# Patient Record
Sex: Female | Born: 1940 | Race: Black or African American | Hispanic: No | Marital: Married | Smoking: Never smoker
Health system: Southern US, Community
[De-identification: ages and names within clinical notes are randomized; demographics above are authoritative.]

## PROBLEM LIST (undated history)

## (undated) DIAGNOSIS — I639 Cerebral infarction, unspecified: Secondary | ICD-10-CM

## (undated) DIAGNOSIS — R42 Dizziness and giddiness: Secondary | ICD-10-CM

## (undated) HISTORY — PX: TOTAL HIP ARTHROPLASTY: SHX124

---

## 2019-06-03 ENCOUNTER — Ambulatory Visit (INDEPENDENT_AMBULATORY_CARE_PROVIDER_SITE_OTHER): Payer: Medicare PPO | Admitting: Internal Medicine

## 2019-06-04 ENCOUNTER — Other Ambulatory Visit: Payer: Self-pay

## 2019-06-04 ENCOUNTER — Encounter (INDEPENDENT_AMBULATORY_CARE_PROVIDER_SITE_OTHER): Payer: Self-pay | Admitting: *Deleted

## 2019-06-04 ENCOUNTER — Ambulatory Visit (INDEPENDENT_AMBULATORY_CARE_PROVIDER_SITE_OTHER): Payer: Medicare PPO | Admitting: Internal Medicine

## 2019-06-04 ENCOUNTER — Encounter (INDEPENDENT_AMBULATORY_CARE_PROVIDER_SITE_OTHER): Payer: Self-pay | Admitting: Internal Medicine

## 2019-06-04 VITALS — BP 142/90 | HR 79 | Temp 97.6°F | Ht 63.0 in | Wt 159.2 lb

## 2019-06-04 DIAGNOSIS — R059 Cough, unspecified: Secondary | ICD-10-CM

## 2019-06-04 DIAGNOSIS — K219 Gastro-esophageal reflux disease without esophagitis: Secondary | ICD-10-CM | POA: Diagnosis not present

## 2019-06-04 DIAGNOSIS — R05 Cough: Secondary | ICD-10-CM

## 2019-06-04 NOTE — Patient Instructions (Signed)
DG Esophagram.  

## 2019-06-04 NOTE — Progress Notes (Signed)
   Subjective:    Patient ID: Dominique Jenkins, female    DOB: [birth year], 78 y.o.   MRN: [id number]  HPI Referred by Jolyn Nap for GERD. She has seen an ENT and was told she had GERD. Diagnosis of laryngopharyngeal reflux. She saw Dr. Brien Mates. He ordered a 24 esophageal PH test and Esophageal Manometry but family decided against it. Did not do a laryngoscopy  Daughter states her mother has a persistent cough. She coughs all day. The cough comes and goes. Cough since last year.  No dysphagia. Her appetite is good.She denies any GERD. BMs are normal.    Review of Systems History reviewed. No pertinent past medical history.  Past Surgical History:  Procedure Laterality Date  . TOTAL HIP ARTHROPLASTY     2008    No Known Allergies  Current Outpatient Medications on File Prior to Visit  Medication Sig Dispense Refill  . acetaminophen (TYLENOL) 325 MG tablet Take 650 mg by mouth every 6 (six) hours as needed.    Marland Kitchen aspirin 81 MG chewable tablet Chew by mouth daily.    . Cholecalciferol (VITAMIN D3) 10 MCG (400 UNIT) CAPS Take by mouth.    Marland Kitchen omeprazole (PRILOSEC) 20 MG capsule Take 20 mg by mouth 2 (two) times daily before a meal.     No current facility-administered medications on file prior to visit.         Objective:   Physical Exam Blood pressure (!) 142/90, pulse 79, temperature 97.6 F (36.4 C), height 5\' 3"  (1.6 m), weight 159 lb 3.2 oz (72.2 kg). Alert and oriented. Skin warm and dry. Oral mucosa is moist.   . Sclera anicteric, conjunctivae is pink. Thyroid not enlarged. No cervical lymphadenopathy. Lungs clear. Heart regular rate and rhythm.  Abdomen is soft. Bowel sounds are positive. No hepatomegaly. No abdominal masses felt. No tenderness.  No edema to lower extremities.        Assessment & Plan:  GERD. Chronic Cough. Will get a DG esophagram. ? Aspiration. Further recommendations to follow.

## 2019-06-10 ENCOUNTER — Ambulatory Visit (HOSPITAL_COMMUNITY)
Admission: RE | Admit: 2019-06-10 | Discharge: 2019-06-10 | Disposition: A | Discharge status: Home / Self Care | Payer: Medicare PPO | Source: Ambulatory Visit | Attending: Internal Medicine | Admitting: Internal Medicine

## 2019-06-10 ENCOUNTER — Other Ambulatory Visit (INDEPENDENT_AMBULATORY_CARE_PROVIDER_SITE_OTHER): Payer: Self-pay | Admitting: Internal Medicine

## 2019-06-10 ENCOUNTER — Other Ambulatory Visit: Payer: Self-pay

## 2019-06-10 DIAGNOSIS — R059 Cough, unspecified: Secondary | ICD-10-CM

## 2019-06-10 DIAGNOSIS — R05 Cough: Secondary | ICD-10-CM

## 2019-06-10 DIAGNOSIS — K219 Gastro-esophageal reflux disease without esophagitis: Secondary | ICD-10-CM | POA: Diagnosis present

## 2019-06-30 ENCOUNTER — Telehealth (INDEPENDENT_AMBULATORY_CARE_PROVIDER_SITE_OTHER): Payer: Self-pay | Admitting: Internal Medicine

## 2019-06-30 NOTE — Telephone Encounter (Signed)
Please call daughter Lawrence Santiago regarding test results - ph# [phone]

## 2019-06-30 NOTE — Telephone Encounter (Signed)
Message left on answering machine to return my call.  

## 2019-07-02 ENCOUNTER — Telehealth (INDEPENDENT_AMBULATORY_CARE_PROVIDER_SITE_OTHER): Payer: Self-pay | Admitting: Internal Medicine

## 2019-07-02 NOTE — Telephone Encounter (Signed)
Needs OV in 3-4 months with Dr. Laural Golden

## 2019-07-02 NOTE — Telephone Encounter (Signed)
Results given to daughter. I had been unable to reach her.  Will let Dr. Laural Golden re-evaluate in 3-4 months to see if she needs to be dilated. Right now, she is not having any problems swallowing. Does have a cough. No reflux seen on Esophagram.

## 2019-07-23 ENCOUNTER — Emergency Department (HOSPITAL_COMMUNITY): Payer: Medicare PPO

## 2019-07-23 ENCOUNTER — Emergency Department (HOSPITAL_COMMUNITY)
Admission: EM | Admit: 2019-07-23 | Discharge: 2019-07-23 | Disposition: A | Discharge status: Home / Self Care | Payer: Medicare PPO | Attending: Emergency Medicine | Admitting: Emergency Medicine

## 2019-07-23 ENCOUNTER — Encounter (HOSPITAL_COMMUNITY): Payer: Self-pay | Admitting: Emergency Medicine

## 2019-07-23 ENCOUNTER — Other Ambulatory Visit: Payer: Self-pay

## 2019-07-23 DIAGNOSIS — Z8673 Personal history of transient ischemic attack (TIA), and cerebral infarction without residual deficits: Secondary | ICD-10-CM | POA: Diagnosis not present

## 2019-07-23 DIAGNOSIS — Z79899 Other long term (current) drug therapy: Secondary | ICD-10-CM | POA: Insufficient documentation

## 2019-07-23 DIAGNOSIS — R112 Nausea with vomiting, unspecified: Secondary | ICD-10-CM | POA: Diagnosis not present

## 2019-07-23 DIAGNOSIS — Z7982 Long term (current) use of aspirin: Secondary | ICD-10-CM | POA: Diagnosis not present

## 2019-07-23 DIAGNOSIS — Z20828 Contact with and (suspected) exposure to other viral communicable diseases: Secondary | ICD-10-CM | POA: Diagnosis not present

## 2019-07-23 DIAGNOSIS — R1110 Vomiting, unspecified: Secondary | ICD-10-CM | POA: Diagnosis present

## 2019-07-23 HISTORY — DX: Dizziness and giddiness: R42

## 2019-07-23 HISTORY — DX: Cerebral infarction, unspecified: I63.9

## 2019-07-23 LAB — COMPREHENSIVE METABOLIC PANEL
ALT: 13 U/L (ref 0–44)
AST: 19 U/L (ref 15–41)
Albumin: 3.8 g/dL (ref 3.5–5.0)
Alkaline Phosphatase: 69 U/L (ref 38–126)
Anion gap: 10 (ref 5–15)
BUN: 12 mg/dL (ref 8–23)
CO2: 24 mmol/L (ref 22–32)
Calcium: 9.1 mg/dL (ref 8.9–10.3)
Chloride: 102 mmol/L (ref 98–111)
Creatinine, Ser: 0.77 mg/dL (ref 0.44–1.00)
GFR calc Af Amer: >60 mL/min (ref >60)
GFR calc non Af Amer: >60 mL/min (ref >60)
Glucose, Bld: 107 mg/dL — ABNORMAL HIGH (ref 70–99)
Potassium: 3.5 mmol/L (ref 3.5–5.1)
Sodium: 136 mmol/L (ref 135–145)
Total Bilirubin: 0.6 mg/dL (ref 0.3–1.2)
Total Protein: 7.7 g/dL (ref 6.5–8.1)

## 2019-07-23 LAB — URINALYSIS, ROUTINE W REFLEX MICROSCOPIC
Bacteria, UA: NONE SEEN
Bilirubin Urine: NEGATIVE
Glucose, UA: NEGATIVE mg/dL
Ketones, ur: NEGATIVE mg/dL
Leukocytes,Ua: NEGATIVE
Nitrite: NEGATIVE
Protein, ur: NEGATIVE mg/dL
Specific Gravity, Urine: 1.014 (ref 1.005–1.030)
pH: 7 (ref 5.0–8.0)

## 2019-07-23 LAB — CBC
HCT: 41 % (ref 36.0–46.0)
Hemoglobin: 12.5 g/dL (ref 12.0–15.0)
MCH: 28.3 pg (ref 26.0–34.0)
MCHC: 30.5 g/dL (ref 30.0–36.0)
MCV: 93 fL (ref 80.0–100.0)
Platelets: 256 10*3/uL (ref 150–400)
RBC: 4.41 MIL/uL (ref 3.87–5.11)
RDW: 13 % (ref 11.5–15.5)
WBC: 8 10*3/uL (ref 4.0–10.5)
nRBC: 0 % (ref 0.0–0.2)

## 2019-07-23 LAB — LIPASE, BLOOD: Lipase: 24 U/L (ref 11–51)

## 2019-07-23 MED ORDER — BENZONATATE 100 MG PO CAPS: 100.0000 mg | ORAL_CAPSULE | Freq: Three times a day (TID) | ORAL | 0 refills | Status: DC

## 2019-07-23 MED ORDER — IOHEXOL 300 MG/ML  SOLN
100.0000 mL | Freq: Once | INTRAMUSCULAR | Status: AC | PRN
  Administered 2019-07-23: 100 mL via INTRAVENOUS

## 2019-07-23 MED ORDER — SODIUM CHLORIDE 0.9 % IV BOLUS
1000.0000 mL | Freq: Once | INTRAVENOUS | Status: AC
  Administered 2019-07-23: 16:00:00 1000 mL via INTRAVENOUS

## 2019-07-23 MED ORDER — ALUMINUM-MAGNESIUM-SIMETHICONE 200-200-20 MG/5ML PO SUSP: 30.0000 mL | Freq: Three times a day (TID) | ORAL | 0 refills | Status: DC

## 2019-07-23 MED ORDER — SODIUM CHLORIDE 0.9% FLUSH: 3.0000 mL | Freq: Once | INTRAVENOUS | Status: DC

## 2019-07-23 NOTE — ED Triage Notes (Signed)
Patient started vomiting yesterday, was seen at another facility, given Zofran. Patient dizzy with mvmt, started vomiting on awakening this am.

## 2019-07-23 NOTE — ED Notes (Signed)
Patient had a depend on which was soiled with urine.

## 2019-07-23 NOTE — Discharge Instructions (Signed)
Please do not take the Zofran that was prescribed to you. Take the Maalox and the Tessalon Perles as needed for your symptoms. Return to the ED if you start to have worsening symptoms, increased abdominal pain or develop a fever, vomiting or coughing up blood, blood in your stool, lightheadedness or increased confusion.

## 2019-07-23 NOTE — ED Provider Notes (Signed)
[facility] EMERGENCY DEPARTMENT Provider Note   CSN: [id number] Arrival date & time: 07/23/19  1309    History   Chief Complaint Chief Complaint  Patient presents with   Emesis    HPI Dominique KatayamaDorothy Jenkins is a 78 y.o. 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  States that yesterday after dinner patient was at home with her husband when she began vomiting.  Daughter at bedside provides much of history.  She had a normal bowel movement yesterday.  Due to the intractable vomiting, patient [facility] with [facility].  She had lab work, chest and abdominal x-ray done which she believes were unremarkable.  She was given ODT Zofran and discharged home.  Tried to take ODT Zofran this morning but had persistent vomiting.  Now complaining of dizziness since waking up this morning feeling like the room is spinning around her worse with movement.  Denies any abdominal pain.  Daughter at bedside states that patient is at her mental baseline.  Daughter states that patient was not given IV fluids and urine was not checked yesterday which she is concerned about.  Denies any complaints of dysuria, fever, sick contacts with similar symptoms.     HPI  Past Medical History:  Diagnosis Date   Stroke (HCC)    2011, r sided weakness   Vertigo     Patient Active Problem List   Diagnosis Date Noted   GERD (gastroesophageal reflux disease) 06/04/2019    Past Surgical History:  Procedure Laterality Date   TOTAL HIP ARTHROPLASTY     2008     OB History    Gravida  3   Para  3   Term  3   Preterm      AB      Living        SAB      TAB      Ectopic      Multiple      Live Births               Home Medications    Prior to Admission medications   Medication Sig Start Date End Date Taking? Authorizing Provider  acetaminophen (TYLENOL) 325 MG tablet  Take 650 mg by mouth every 6 (six) hours as needed.   Yes [provider]  aspirin 81 MG chewable tablet Chew by mouth daily.   Yes [provider]  Cholecalciferol (VITAMIN D3) 10 MCG (400 UNIT) CAPS Take 400 Units by mouth daily.    Yes [provider]  omeprazole (PRILOSEC) 20 MG capsule Take 20 mg by mouth 2 (two) times daily before a meal.   Yes [provider]  ondansetron (ZOFRAN-ODT) 4 MG disintegrating tablet Take 1 tablet by mouth every 6 (six) hours as needed. 07/23/19  Yes [provider]  aluminum-magnesium hydroxide-simethicone (MAALOX) 200-200-20 MG/5ML SUSP Take 30 mLs by mouth 4 (four) times daily -  before meals and at bedtime. 07/23/19   Dominique Gandolfi, PA-C  benzonatate (TESSALON) 100 MG capsule Take 1 capsule (100 mg total) by mouth every 8 (eight) hours. 07/23/19   Dietrich PatesKhatri, Dominique Kruser, PA-C    Family History Family History  Family history unknown: Yes    Social History Social History   Tobacco Use   Smoking status: Never Smoker   Smokeless tobacco: Never Used  Substance Use Topics   Alcohol use: Never  Frequency: Never   Drug use: Never     Allergies   Patient has no known allergies.   Review of Systems Review of Systems  Constitutional: Negative for appetite change, chills and fever.  HENT: Negative for ear pain, rhinorrhea, sneezing and sore throat.   Eyes: Negative for photophobia and visual disturbance.  Respiratory: Negative for cough, chest tightness, shortness of breath and wheezing.   Cardiovascular: Negative for chest pain and palpitations.  Gastrointestinal: Positive for vomiting. Negative for abdominal pain, blood in stool, constipation, diarrhea and nausea.  Genitourinary: Negative for dysuria, hematuria and urgency.  Musculoskeletal: Negative for myalgias.  Skin: Negative for rash.  Neurological: Positive for dizziness. Negative for weakness and light-headedness.     Physical Exam Updated Vital  Signs BP 138/71 (BP Location: Left Arm)    Pulse 92    Temp 98.4 F (36.9 C) (Oral)    Resp 18    SpO2 93%   Physical Exam Vitals signs and nursing note reviewed.  Constitutional:      General: She is not in acute distress.    Appearance: She is well-developed.  HENT:     Head: Normocephalic and atraumatic.     Nose: Nose normal.  Eyes:     General: No scleral icterus.       Right eye: No discharge.        Left eye: No discharge.     Conjunctiva/sclera: Conjunctivae normal.     Pupils: Pupils are equal, round, and reactive to light.     Comments: Horizontal nystagmus noted bilaterally.  Neck:     Musculoskeletal: Normal range of motion and neck supple.  Cardiovascular:     Rate and Rhythm: Normal rate and regular rhythm.     Heart sounds: Normal heart sounds. No murmur. No friction rub. No gallop.   Pulmonary:     Effort: Pulmonary effort is normal. No respiratory distress.     Breath sounds: Normal breath sounds.  Abdominal:     General: Bowel sounds are normal. There is no distension.     Palpations: Abdomen is soft.     Tenderness: There is no abdominal tenderness. There is no guarding.  Musculoskeletal: Normal range of motion.  Skin:    General: Skin is warm and dry.     Findings: No rash.  Neurological:     Mental Status: She is alert.     Motor: No abnormal muscle tone.     Coordination: Coordination normal.     Comments: Oriented to self, family member at bedside.  [place] but does not know the city.  Does not know the current year or day of the week or month. Pupils reactive. No facial asymmetry noted. Cranial nerves appear grossly intact. Sensation intact to light touch on face, BUE and BLE. Strength 5/5 in BUE and BLE.       ED Treatments / Results  Labs (all labs ordered are listed, but only abnormal results are displayed) Labs Reviewed  COMPREHENSIVE METABOLIC PANEL - Abnormal; Notable for the following components:      Result  Value   Glucose, Bld 107 (*)    All other components within normal limits  URINALYSIS, ROUTINE W REFLEX MICROSCOPIC - Abnormal; Notable for the following components:   Color, Urine COLORLESS (*)    Hgb urine dipstick SMALL (*)    All other components within normal limits  URINE CULTURE  LIPASE, BLOOD  CBC    EKG None  Radiology Ct Chest W Contrast  Result Date: 07/23/2019 CLINICAL DATA:  Vomiting. EXAM: CT CHEST, ABDOMEN, AND PELVIS WITH CONTRAST TECHNIQUE: Multidetector CT imaging of the chest, abdomen and pelvis was performed following the standard protocol during bolus administration of intravenous contrast. CONTRAST:  100mL OMNIPAQUE IOHEXOL 300 MG/ML  SOLN COMPARISON:  None. FINDINGS: CT CHEST FINDINGS Cardiovascular: Heart size upper normal. No substantial pericardial effusion. Coronary artery calcification is evident. Atherosclerotic calcification is noted in the wall of the thoracic aorta. Mediastinum/Nodes: 11 mm short axis low right paratracheal node is mildly enlarged. The 8 mm short axis AP window lymph node is upper normal. There is no hilar lymphadenopathy. The esophagus has normal imaging features. There is no axillary lymphadenopathy. Lungs/Pleura: Diffuse interstitial opacity is associated with bronchiectasis in all lobes of both lungs and subpleural reticulation with a basilar predominance. Scarring noted along the posterior right major fissure. No pleural effusion. Musculoskeletal: No worrisome lytic or sclerotic osseous abnormality. CT ABDOMEN PELVIS FINDINGS Hepatobiliary: Scattered small hypoattenuating lesions in the liver measure up to about 7 mm and are too small to characterize. Tiny layering gallstones noted. Common bile duct measures 8 mm in diameter, upper normal to minimally dilated for patient age. Pancreas: No focal mass lesion. No dilatation of the main duct. No intraparenchymal cyst. No peripancreatic edema. Spleen: No splenomegaly. No focal mass lesion.  Adrenals/Urinary Tract: No adrenal nodule or mass. Tiny bilateral hypoattenuating lesions are identified in both kidneys, too small to characterize. 8 mm exophytic lesion posterior lower left kidney (54/2) has attenuation higher than would be expected for a simple cyst. Similar 10 mm lesion noted posterior interpolar left kidney on 52/2. No evidence for hydroureter. Portions of the bladder are obscured by beam hardening artifact from left hip replacement. Stomach/Bowel: Stomach is unremarkable. No gastric wall thickening. No evidence of outlet obstruction. Duodenum is normally positioned as is the ligament of Treitz. No small bowel wall thickening. No small bowel dilatation. The terminal ileum is normal. The appendix is not visualized, but there is no edema or inflammation in the region of the cecum. No gross colonic mass. No colonic wall thickening. Diverticular changes are noted in the left colon without evidence of diverticulitis. Vascular/Lymphatic: There is abdominal aortic atherosclerosis without aneurysm. There is no gastrohepatic or hepatoduodenal ligament lymphadenopathy. No intraperitoneal or retroperitoneal lymphadenopathy. No pelvic sidewall lymphadenopathy. Reproductive: The uterus is unremarkable.  There is no adnexal mass. Other: No intraperitoneal free fluid. Musculoskeletal: Status post left hip replacement. No worrisome lytic or sclerotic osseous abnormality. IMPRESSION: 1. No acute findings in the chest, abdomen, or pelvis. 2. 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 3. Borderline mediastinal lymphadenopathy, presumably reactive. 4. Hepatic and renal cysts. Some lesions in the kidneys cannot be definitively characterized given their tiny size. 2 hyperattenuating lesions in the right kidney may be cysts complicated by proteinaceous debris or hemorrhage. Follow-up CT in 6 months could be used to ensure stability. 5.  Aortic Atherosclerois (ICD10-170.0)  Electronically Signed   By: Kennith CenterEric  Mansell M.D.   On: 07/23/2019 17:32   Mr Brain Wo Contrast  Result Date: 07/23/2019 CLINICAL DATA:  Peripheral vertigo. EXAM: MRI HEAD WITHOUT CONTRAST TECHNIQUE: Multiplanar, multiecho pulse sequences of the brain and surrounding structures were obtained without intravenous contrast. COMPARISON:  MRI head 03/24/2018 FINDINGS: Brain: Negative for acute infarct. Chronic hemorrhagic infarct left parietal lobe is unchanged. Smaller chronic infarct left frontal lobe also unchanged. In addition, there is extensive chronic microvascular ischemic change throughout the white matter.  Small chronic infarcts in the cerebellum bilaterally. Generalized atrophy without hydrocephalus. Negative for mass lesion. Vascular: Normal arterial flow voids Skull and upper cervical spine: Negative Sinuses/Orbits: Mild mucosal edema paranasal sinuses. Negative orbit Other: None IMPRESSION: No acute abnormality no change from the prior study Atrophy with extensive chronic ischemic change in the white matter as well as a chronic hemorrhagic infarct in the left parietal lobe. Chronic infarct in the left frontal lobe. Electronically Signed   By: Marlan Palau M.D.   On: 07/23/2019 14:45   Ct Abdomen Pelvis W Contrast  Result Date: 07/23/2019 CLINICAL DATA:  Vomiting. EXAM: CT CHEST, ABDOMEN, AND PELVIS WITH CONTRAST TECHNIQUE: Multidetector CT imaging of the chest, abdomen and pelvis was performed following the standard protocol during bolus administration of intravenous contrast. CONTRAST:  OMNIPAQUE IOHEXOL 300 MG/ML  SOLN COMPARISON:  None. FINDINGS: CT CHEST FINDINGS Cardiovascular: Heart size upper normal. No substantial pericardial effusion. Coronary artery calcification is evident. Atherosclerotic calcification is noted in the wall of the thoracic aorta. Mediastinum/Nodes: 11 mm short axis low right paratracheal node is mildly enlarged. The 8 mm short axis AP window lymph node is upper  normal. There is no hilar lymphadenopathy. The esophagus has normal imaging features. There is no axillary lymphadenopathy. Lungs/Pleura: Diffuse interstitial opacity is associated with bronchiectasis in all lobes of both lungs and subpleural reticulation with a basilar predominance. Scarring noted along the posterior right major fissure. No pleural effusion. Musculoskeletal: No worrisome lytic or sclerotic osseous abnormality. CT ABDOMEN PELVIS FINDINGS Hepatobiliary: Scattered small hypoattenuating lesions in the liver measure up to about 7 mm and are too small to characterize. Tiny layering gallstones noted. Common bile duct measures 8 mm in diameter, upper normal to minimally dilated for patient age. Pancreas: No focal mass lesion. No dilatation of the main duct. No intraparenchymal cyst. No peripancreatic edema. Spleen: No splenomegaly. No focal mass lesion. Adrenals/Urinary Tract: No adrenal nodule or mass. Tiny bilateral hypoattenuating lesions are identified in both kidneys, too small to characterize. 8 mm exophytic lesion posterior lower left kidney (54/2) has attenuation higher than would be expected for a simple cyst. Similar 10 mm lesion noted posterior interpolar left kidney on 52/2. No evidence for hydroureter. Portions of the bladder are obscured by beam hardening artifact from left hip replacement. Stomach/Bowel: Stomach is unremarkable. No gastric wall thickening. No evidence of outlet obstruction. Duodenum is normally positioned as is the ligament of Treitz. No small bowel wall thickening. No small bowel dilatation. The terminal ileum is normal. The appendix is not visualized, but there is no edema or inflammation in the region of the cecum. No gross colonic mass. No colonic wall thickening. Diverticular changes are noted in the left colon without evidence of diverticulitis. Vascular/Lymphatic: There is abdominal aortic atherosclerosis without aneurysm. There is no gastrohepatic or hepatoduodenal  ligament lymphadenopathy. No intraperitoneal or retroperitoneal lymphadenopathy. No pelvic sidewall lymphadenopathy. Reproductive: The uterus is unremarkable.  There is no adnexal mass. Other: No intraperitoneal free fluid. Musculoskeletal: Status post left hip replacement. No worrisome lytic or sclerotic osseous abnormality. IMPRESSION: 1. No acute findings in the chest, abdomen, or pelvis. 2. 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 3. Borderline mediastinal lymphadenopathy, presumably reactive. 4. Hepatic and renal cysts. Some lesions in the kidneys cannot be definitively characterized given their tiny size. 2 hyperattenuating lesions in the right kidney may be cysts complicated by proteinaceous debris or hemorrhage. Follow-up CT in 6 months could be used to ensure stability. 5.  Aortic Atherosclerois (  ICD10-170.0) Electronically Signed   By: Misty Stanley M.D.   On: 07/23/2019 17:32    Procedures Procedures (including critical care time)  Medications Ordered in ED Medications  sodium chloride flush (NS) 0.9 % injection 3 mL (has no administration in time range)  sodium chloride 0.9 % bolus 1,000 mL (0 mLs Intravenous Stopped 07/23/19 1719)  iohexol (OMNIPAQUE) 300 MG/ML solution 100 mL (100 mLs Intravenous Contrast Given 07/23/19 1643)     Initial Impression / Assessment and Plan / ED Course  I have reviewed the triage vital signs and the nursing notes.  Pertinent labs & imaging results that were available during my care of the patient were reviewed by me and considered in my medical decision making (see chart for details).        78 year old female with a past medical history of prior CVA presents to ED for dizziness and emesis.  Symptoms began yesterday and worsened today.  She was seen in outside ED and prescribed Zofran after negative work-up.  Reports vertiginous symptoms.  Abdomen is soft, nontender nondistended.  There was some concern for abnormal  chest x-ray yesterday.  No deficits neurological exam noted.  Daughter at bedside states that she is at her mental baseline as far as orientation.  MRI of the brain is negative for acute abnormality.  CT of the abdomen and pelvis, chest with no acute findings.  There is some underlying fibrotic lung disease concern, hepatic and renal cysts.  Lab work here including CMP, CBC, lipase and urinalysis unremarkable.  Patient symptom control in the ED.  EKG shows borderline prolonged QT so we will discontinue use of Zofran.  We will have her follow-up with PCP and return for worsening symptoms.  Patient is hemodynamically stable, in NAD, and able to ambulate in the ED. Evaluation does not show pathology that would require ongoing emergent intervention or inpatient treatment. I explained the diagnosis to the patient. Pain has been managed and has no complaints prior to discharge. Patient is comfortable with above plan and is stable for discharge at this time. All questions were answered prior to disposition. Strict return precautions for returning to the ED were discussed. Encouraged follow up with PCP.   An After Visit Summary was printed and given to the patient.   Portions of this note were generated with [employer]. Dictation errors may occur despite best attempts at proofreading.   Final Clinical Impressions(s) / ED Diagnoses   Final diagnoses:  Non-intractable vomiting with nausea, unspecified vomiting type    ED Discharge Orders         Ordered    benzonatate (TESSALON) 100 MG capsule  Every 8 hours     07/23/19 1847    aluminum-magnesium hydroxide-simethicone (MAALOX) 200-200-20 MG/5ML SUSP  3 times daily before meals & bedtime     07/23/19 1847           Delia Heady, PA-C 07/23/19 1852    Lucrezia Starch, MD 07/24/19 [phone]

## 2019-07-24 LAB — SARS CORONAVIRUS 2 (TAT 6-24 HRS): SARS Coronavirus 2: NEGATIVE

## 2019-07-25 LAB — URINE CULTURE: Culture: NO GROWTH

## 2019-09-23 IMAGING — MR MRI HEAD WITHOUT CONTRAST
8 of 10 series · 35 of 48 positions shown · non-contrast
Comparison: MRI head 03/24/2018

CLINICAL DATA: Peripheral vertigo.

EXAM:
MRI HEAD WITHOUT CONTRAST
TECHNIQUE: Multiplanar, multiecho pulse sequences of the brain and surrounding
structures were obtained without intravenous contrast.

[Series 2: T1 · sagittal · 5.0mm · 0.40mm/px · 2 of 20 slices shown (1 of 2)]
[im 1/20]
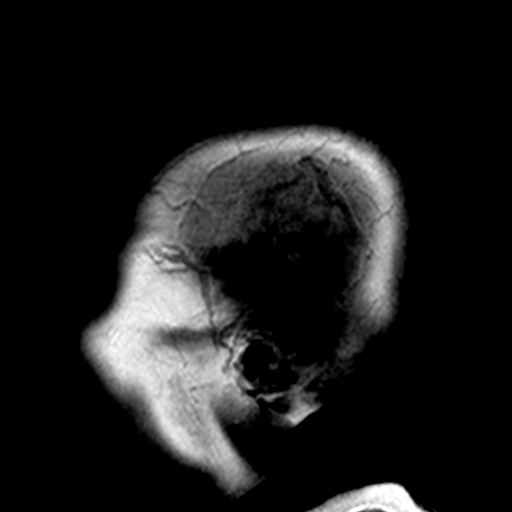
[im 20/20]
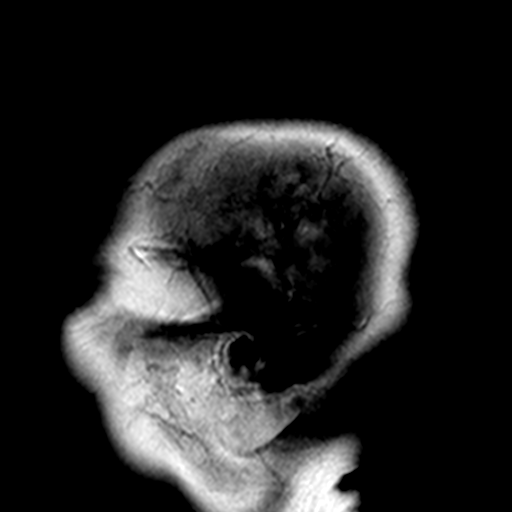

[Series 3: DWI · axial · 3.0mm · 0.74mm/px · z∈[-74,+84]mm · 7 of 54 slices shown (1 of 2)]
[im 1/54]
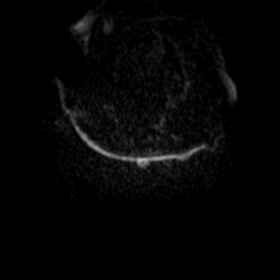
[im 9/54]
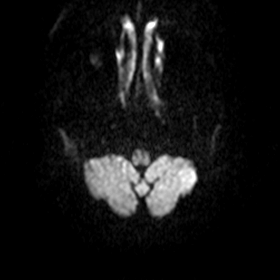
[im 18/54]
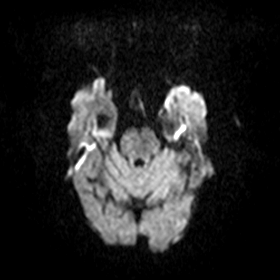
[im 27/54]
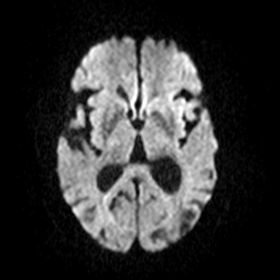
[im 36/54]
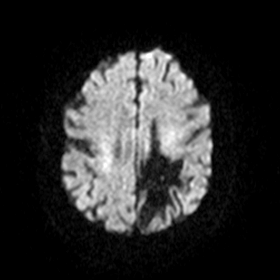
[im 45/54]
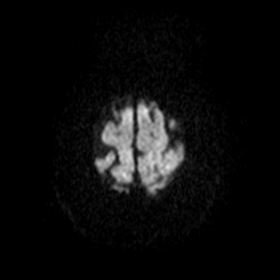
[im 54/54]
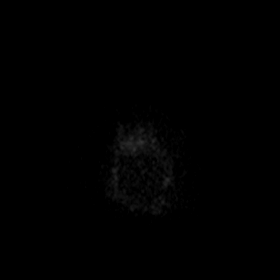

[Series 5: DWI · coronal · 5.0mm · 0.45mm/px · 4 of 34 slices shown (2 of 2)]
[im 1/34]
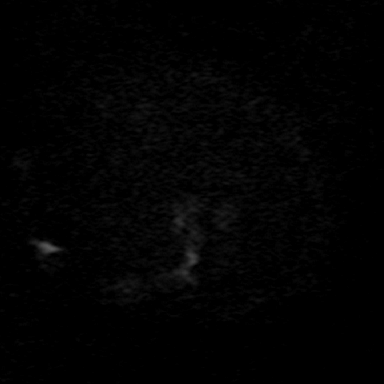
[im 12/34]
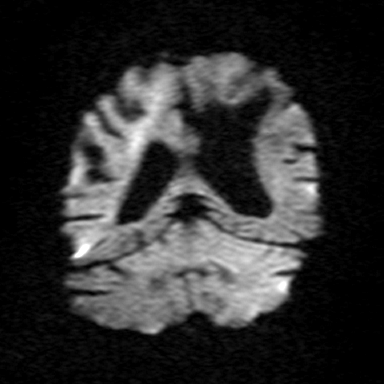
[im 23/34]
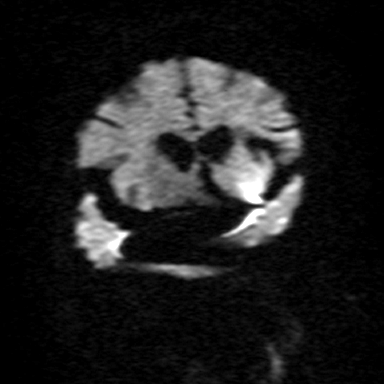
[im 34/34]
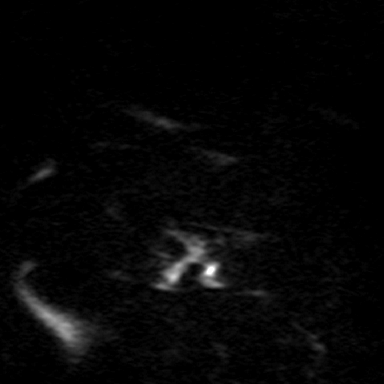

[Series 7: T2 · axial · 5.0mm · 0.66mm/px · z∈[-65,+75]mm · 3 of 23 slices shown (1 of 3)]
[im 1/23]
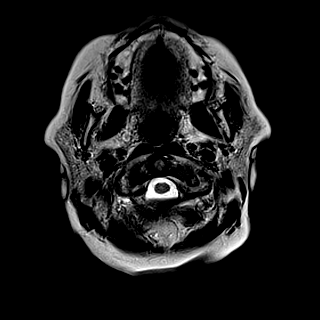
[im 12/23]
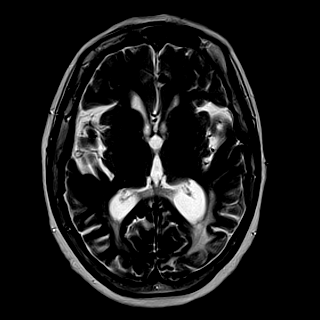
[im 23/23]
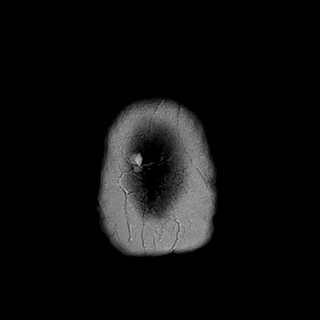

[Series 8: T2 · axial · 5.0mm · 0.41mm/px · z∈[-58,+69]mm · 3 of 21 slices shown (2 of 3)]
[im 1/21]
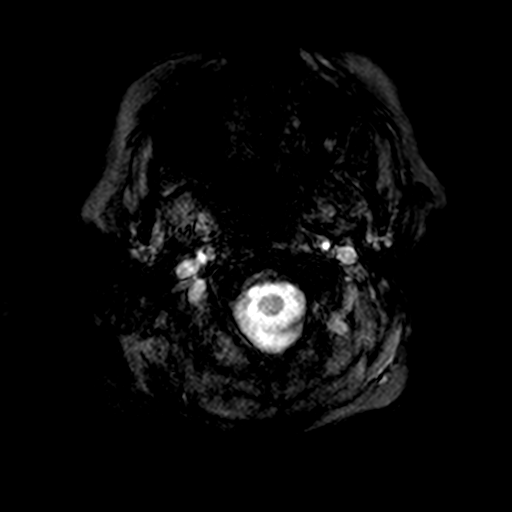
[im 11/21]
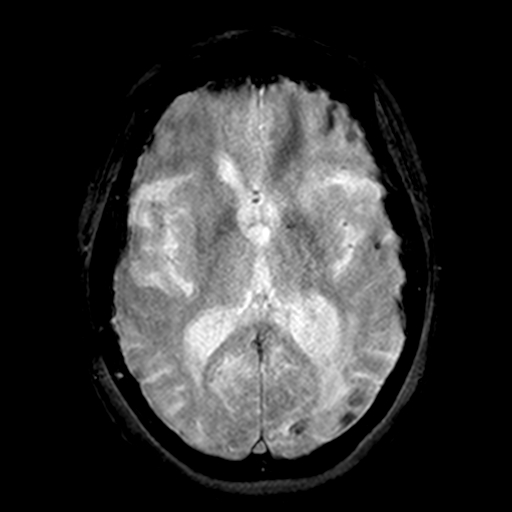
[im 21/21]
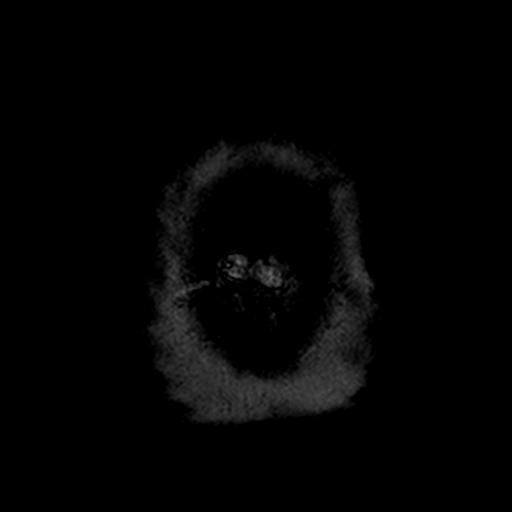

[Series 9: FLAIR · axial · 3.0mm · 0.79mm/px · z∈[-63,+72]mm · 6 of 47 slices shown]
[im 1/47]
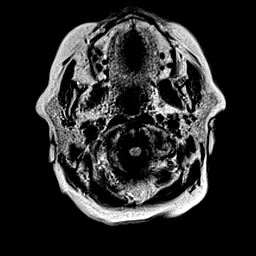
[im 10/47]
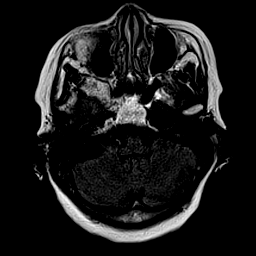
[im 19/47]
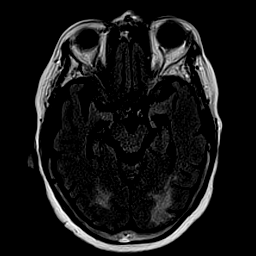
[im 28/47]
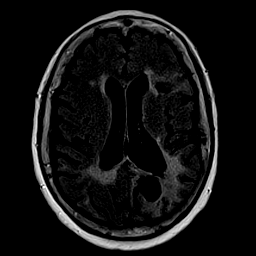
[im 37/47]
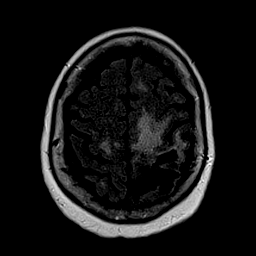
[im 47/47]
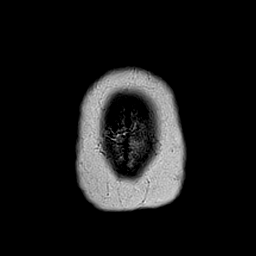

[Series 10: T1 · axial · 2.0mm · 0.40mm/px · z∈[-63,+56]mm · 7 of 71 slices shown (2 of 2)]
[im 1/71]
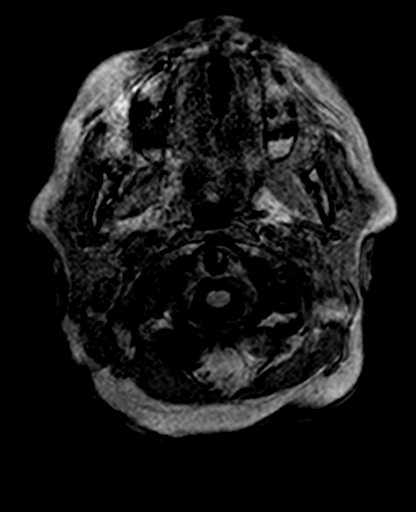
[im 9/71]
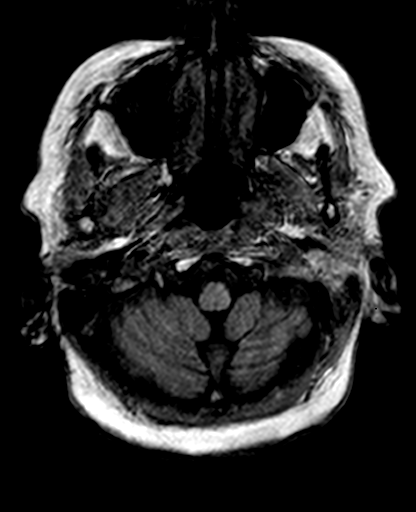
[im 18/71]
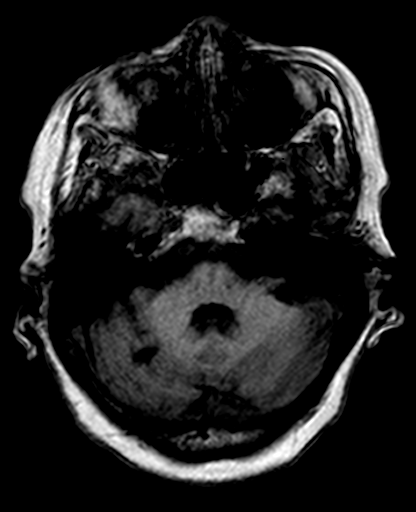
[im 27/71]
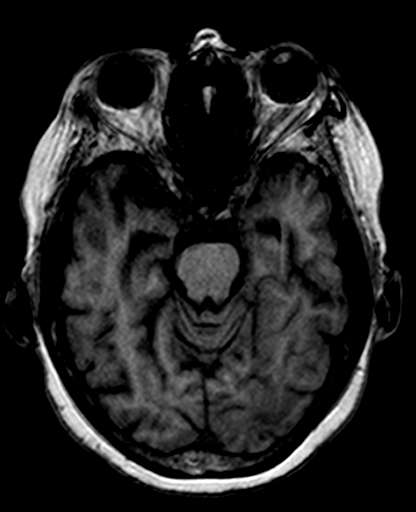
[im 44/71]
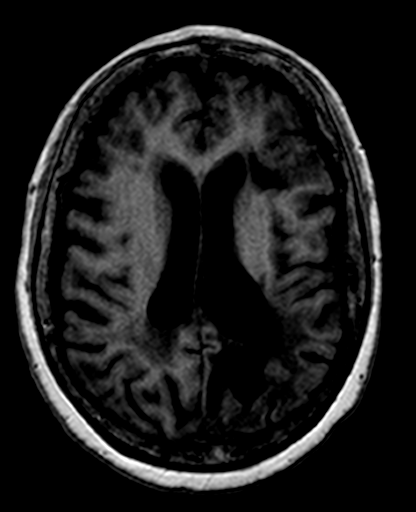
[im 53/71]
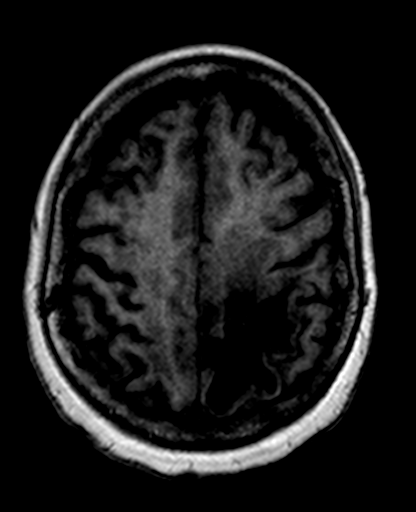
[im 62/71]
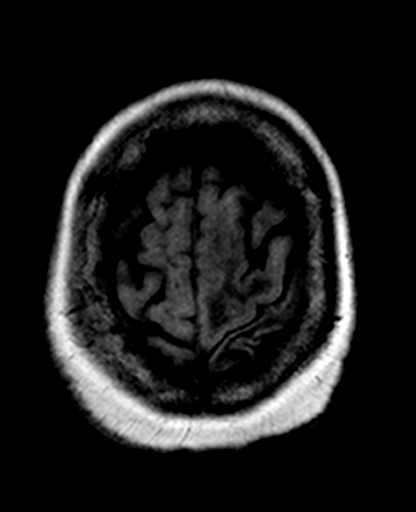

[Series 11: T2 · coronal · 5.0mm · 0.57mm/px · 3 of 28 slices shown (3 of 3)]
[im 1/28]
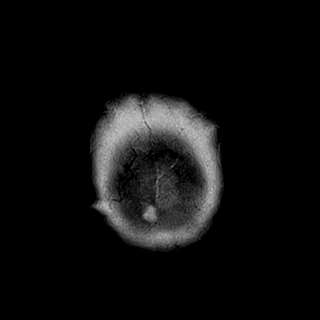
[im 14/28]
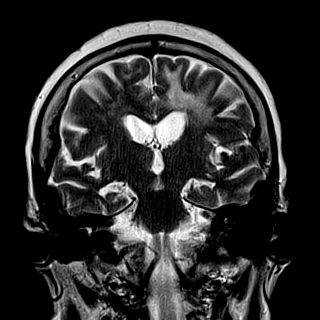
[im 28/28]
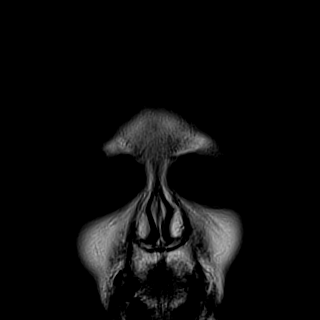

[35 of 48 positions shown; findings below may reference images not displayed]

FINDINGS: Brain: Negative for acute infarct.

Chronic hemorrhagic infarct left parietal lobe is unchanged. Smaller
chronic infarct left frontal lobe also unchanged. In addition, there
is extensive chronic microvascular ischemic change throughout the
white matter. Small chronic infarcts in the cerebellum bilaterally.
Generalized atrophy without hydrocephalus. Negative for mass lesion.

Vascular: Normal arterial flow voids

Skull and upper cervical spine: Negative

Sinuses/Orbits: Mild mucosal edema paranasal sinuses. Negative orbit

Other: None
IMPRESSION: No acute abnormality no change from the prior study

Atrophy with extensive chronic ischemic change in the white matter
as well as a chronic hemorrhagic infarct in the left parietal lobe.
Chronic infarct in the left frontal lobe.

## 2019-09-24 ENCOUNTER — Other Ambulatory Visit: Payer: Self-pay

## 2019-09-24 ENCOUNTER — Encounter (INDEPENDENT_AMBULATORY_CARE_PROVIDER_SITE_OTHER): Payer: Medicare PPO | Admitting: Ophthalmology

## 2019-09-24 DIAGNOSIS — H35342 Macular cyst, hole, or pseudohole, left eye: Secondary | ICD-10-CM | POA: Diagnosis not present

## 2019-09-24 DIAGNOSIS — I10 Essential (primary) hypertension: Secondary | ICD-10-CM

## 2019-09-24 DIAGNOSIS — H35033 Hypertensive retinopathy, bilateral: Secondary | ICD-10-CM | POA: Diagnosis not present

## 2019-09-24 DIAGNOSIS — H43813 Vitreous degeneration, bilateral: Secondary | ICD-10-CM

## 2019-09-24 DIAGNOSIS — H2513 Age-related nuclear cataract, bilateral: Secondary | ICD-10-CM

## 2019-10-26 ENCOUNTER — Encounter (INDEPENDENT_AMBULATORY_CARE_PROVIDER_SITE_OTHER): Payer: Self-pay | Admitting: Internal Medicine

## 2019-10-26 ENCOUNTER — Ambulatory Visit (INDEPENDENT_AMBULATORY_CARE_PROVIDER_SITE_OTHER): Payer: Medicare PPO | Admitting: Internal Medicine

## 2019-10-26 ENCOUNTER — Other Ambulatory Visit: Payer: Self-pay

## 2019-10-26 DIAGNOSIS — R05 Cough: Secondary | ICD-10-CM

## 2019-10-26 DIAGNOSIS — J849 Interstitial pulmonary disease, unspecified: Secondary | ICD-10-CM | POA: Insufficient documentation

## 2019-10-26 DIAGNOSIS — J8410 Pulmonary fibrosis, unspecified: Secondary | ICD-10-CM | POA: Diagnosis not present

## 2019-10-26 DIAGNOSIS — R059 Cough, unspecified: Secondary | ICD-10-CM | POA: Insufficient documentation

## 2019-10-26 MED ORDER — PANTOPRAZOLE SODIUM 40 MG PO TBEC: 40.0000 mg | DELAYED_RELEASE_TABLET | Freq: Every day | ORAL | 5 refills | Status: DC

## 2019-10-26 NOTE — Patient Instructions (Signed)
Pulmonary consultation to be arranged.

## 2019-10-26 NOTE — Progress Notes (Signed)
Presenting complaint;  Follow for chronic cough  Database and subjective:  Patient is 78 year old Afro-American female who is here for scheduled visit accompanied by his daughter Maren Beach for chronic cough over a year ago.  She was last seen on 06/04/2019.  She previously was evaluated by ENT specialist and no pharyngeal or laryngeal abnormality noted.  Patient has been on omeprazole but daughter believes it did not help with her cough.  Following her visit to our office she underwent esophagogram which revealed some residual contrast in the valleculae but there was no laryngeal aspiration.  She was felt to have marked diffuse esophageal dysmotility.  Barium pill got held up at distal esophagus just proximal to GE junction.  Patient was seen in emergency room on 07/23/2019 for vomiting and dizziness.  She had chest and abdominal pelvic CT.  Chest CT revealed parenchymal changes consistent with pulmonary fibrosis.  Patient was given albuterol inhaler by her primary care physician which she is using on as-needed basis and feels it may have helped decrease cough. Coughing occurs at different times.  She does bring out small amount of clear sputum. Patient denies heartburn regurgitation or dysphagia.  No history of peptic ulcer disease.  Her appetite is good.  She has lost 5 pounds since her last visit and her daughter states she is eating less sweets. 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 She denies abdominal pain melena or rectal bleeding.  Last colonoscopy was about 7 years ago and was normal. Patient is using walker to ambulate.   Current Medications: Outpatient Encounter Medications as of 10/26/2019  Medication Sig  . acetaminophen (TYLENOL) 325 MG tablet Take 650 mg by mouth every 6 (six) hours as needed.  Marland Kitchen aspirin 81 MG chewable tablet Chew by mouth daily.  . Cholecalciferol (VITAMIN D3) 10 MCG  (400 UNIT) CAPS Take 400 Units by mouth daily.   Marland Kitchen aluminum-magnesium hydroxide-simethicone (MAALOX) 200-200-20 MG/5ML SUSP Take 30 mLs by mouth 4 (four) times daily -  before meals and at bedtime. (Patient not taking: Reported on 10/26/2019)  . omeprazole (PRILOSEC) 20 MG capsule Take 20 mg by mouth 2 (two) times daily before a meal.  . ondansetron (ZOFRAN-ODT) 4 MG disintegrating tablet Take 1 tablet by mouth every 6 (six) hours as needed.  . [DISCONTINUED] benzonatate (TESSALON) 100 MG capsule Take 1 capsule (100 mg total) by mouth every 8 (eight) hours. (Patient not taking: Reported on 10/26/2019)   No facility-administered encounter medications on file as of 10/26/2019.     Objective: Blood pressure 119/75, pulse 80, temperature (!) 97.5 F (36.4 C), temperature source Oral, height 5\' 3"  (1.6 m), weight 154 lb 11.2 oz (70.2 kg). Patient is alert and in no acute distress. Her speech is normal. Conjunctiva is pink. Sclera is nonicteric Oropharyngeal mucosa is normal. No neck masses or thyromegaly noted. Cardiac exam with regular rhythm normal S1 and S2. No murmur or gallop noted. Lungs are clear to auscultation. Abdomen is soft and nontender with organomegaly or masses   Labs/studies Results: Barium study as well as chest and abdominal pelvic CT images reviewed with patient and her daughter.  Assessment:  #1.  Chronic cough.  She has been evaluated by ENT specialist in the past and she did not respond to omeprazole twice daily scheduled.  She does not have other symptoms that would go along with GERD.  Given CT pulmonary findings it is possible  her cough is due to lung disease.  Until pulmonary evaluation completed we will leave her on PPI but at a low dose.  #2.  Esophageal dysmotility.  Barium pill did not readily passed into the stomach.  However she does not have dysphagia with solids or pills.  Review of barium study does not reveal narrowing or stricture distally.  Therefore  would not recommend esophagogastroduodenoscopy unless she develops dysphagia.  #2.  Pulmonary fibrosis noted on CT that she had on 07/23/2019 during ER visit.  No exposure to asbestos.  No issues with allergies.  Patient is using albuterol inhaler on as-needed basis.  If it is helping with her cough she can use it on schedule may be three times a day.  Plan:  Will arrange for consultation with [facility] as Dr. Luan Pulling is not seeing new patients. Discontinue omeprazole. Begin pantoprazole 40 mg p.o. every morning.  She will take it 30 minutes before breakfast.  Will plan to drop the dose to every other day and thereafter on as-needed basis when pulmonary evaluation completed. Office visit on as-needed basis.

## 2019-11-02 ENCOUNTER — Ambulatory Visit (INDEPENDENT_AMBULATORY_CARE_PROVIDER_SITE_OTHER): Payer: Medicare PPO | Admitting: Internal Medicine

## 2019-12-30 ENCOUNTER — Institutional Professional Consult (permissible substitution): Payer: Medicare PPO | Admitting: Emergency Medicine

## 2019-12-31 ENCOUNTER — Telehealth: Payer: Self-pay | Admitting: Emergency Medicine

## 2019-12-31 ENCOUNTER — Ambulatory Visit: Payer: Medicare PPO | Admitting: Emergency Medicine

## 2019-12-31 ENCOUNTER — Other Ambulatory Visit: Payer: Self-pay

## 2019-12-31 ENCOUNTER — Encounter: Payer: Self-pay | Admitting: Emergency Medicine

## 2019-12-31 DIAGNOSIS — J849 Interstitial pulmonary disease, unspecified: Secondary | ICD-10-CM

## 2019-12-31 DIAGNOSIS — R05 Cough: Secondary | ICD-10-CM

## 2019-12-31 DIAGNOSIS — R059 Cough, unspecified: Secondary | ICD-10-CM

## 2019-12-31 NOTE — Telephone Encounter (Signed)
Patient was seen in clinic today by Dr. Delton Coombes. Only had labs done. PFT was ordered. No notation regarding a call made to the patient daughter.

## 2019-12-31 NOTE — Assessment & Plan Note (Signed)
She denies any overt aspiration although she is at risk for this as discussed above.  She has been treated empirically for GERD.  Could consider a repeat esophagram or SLP evaluation if our suspicion for chronic aspiration persists.  An ENT evaluation did not identify a structural abnormality.  She does have some clear mucus production, question chronic allergic drainage down the back of her throat.  She has not been tried on antihistamines and I think this would be reasonable.  She will try loratadine, see if she gets any benefit.  Continue her cough drops for suppression.

## 2019-12-31 NOTE — Assessment & Plan Note (Signed)
I am most suspicious that this is chronic scar (in absence of groundglass, overt honeycomb changes) due to a period of dysphagia following her stroke in 2011.  She may be having intermittent persistent silent aspiration although she denies any cough with eating or drinking, had a reassuring esophagram in July 2020.  She needs pulmonary function testing, autoimmune evaluation to look for potential contributors to ILD.  Ultimately she will require walking oximetry, likely repeat chest imaging.  Depending on the evaluation and especially any progression on CT she may be a candidate for antifibrotic therapy.  I will refer her to see Dr. Marchelle Gearing to pick up the work-up and evaluation, help her arrange for antifibrotic therapy in the future if it becomes indicated.

## 2019-12-31 NOTE — Patient Instructions (Addendum)
We will perform blood work today to evaluate for possible inflammatory causes of interstitial lung disease (lung scarring). We will arrange for pulmonary function testing Try starting loratadine 10 mg daily to see if this decreases drainage to your throat, decreases your cough. Continue to use your cough drops as needed. We will arrange for you to follow-up with Dr. Marchelle Gearing in our office to review your lab work, plan any repeat CT scan of your chest.  Based on this information he may consider recommending medication aimed at decreasing progression of scarring.

## 2019-12-31 NOTE — Progress Notes (Signed)
Subjective:    Patient ID: Dominique Jenkins, female    DOB: 20-Mar-1941, 79 y.o.   MRN: [id number]  HPI Dominique Jenkins is 79, never smoker with a history of CVA and residual right-sided weakness, associated dysphagia that required gastrostomy tube for 3 months, now able to take enteral nutrition.  She is referred today for interstitial lung disease on CT chest from 07/23/2019 in the setting of cough. She has been under evaluation for cough, reassuring upper airway evaluation by ENT and subsequently GI evaluation.  An esophagram done July 2020 showed some dysphagia and significant esophageal dysmotility at the distal esophagus without obvious obstruction or narrowing.  She reports today that she has had dry cough for about a year, can be productive of clear.  No real nasal gtt. Not really associated with sleep or meals. She denies any dysphagia, cough w meals or drinking. Trial of PPI did not change the cough. She was also given albuterol - uses it a couple times a week but no real change. She is able to get around the house w a walker, no associated dyspnea. She still uses an exercise bike.   CT abdomen and CT chest done on 07/23/2019 reviewed by me, show subpleural reticulation with some associated bronchiectatic change, base predominant with some scarring noted along the posterior right major fissure.  There is no overt honeycomb change but overall the distribution is in a UIP pattern.  No groundglass.   Review of Systems  Constitutional: Negative.   HENT: Negative.   Respiratory: Positive for cough.   Cardiovascular: Negative.   Gastrointestinal: Negative.     Past Medical History:  Diagnosis Date  . Stroke (HCC)    2011, r sided weakness  . Vertigo      Family History  Family history unknown: Yes  no hx ILD   Social History   Socioeconomic History  . Marital status: Married    Spouse name: Not on file  . Number of children: Not on file  . Years of education: Not on file  . Highest  education level: Not on file  Occupational History  . Not on file  Tobacco Use  . Smoking status: Never Smoker  . Smokeless tobacco: Never Used  Substance and Sexual Activity  . Alcohol use: Never  . Drug use: Never  . Sexual activity: Not on file  Other Topics Concern  . Not on file  Social History Narrative  . Not on file   Social Determinants of Health   Financial Resource Strain:   . Difficulty of Paying Living Expenses: Not on file  Food Insecurity:   . Worried About Programme researcher, broadcasting/film/video in the Last Year: Not on file  . Ran Out of Food in the Last Year: Not on file  Transportation Needs:   . Lack of Transportation (Medical): Not on file  . Lack of Transportation (Non-Medical): Not on file  Physical Activity:   . Days of Exercise per Week: Not on file  . Minutes of Exercise per Session: Not on file  Stress:   . Feeling of Stress : Not on file  Social Connections:   . Frequency of Communication with Friends and Family: Not on file  . Frequency of Social Gatherings with Friends and Family: Not on file  . Attends Religious Services: Not on file  . Active Member of Clubs or Organizations: Not on file  . Attends Banker Meetings: Not on file  . Marital Status: Not on file  Intimate Partner Violence:   . Fear of Current or Ex-Partner: Not on file  . Emotionally Abused: Not on file  . Physically Abused: Not on file  . Sexually Abused: Not on file    She worked in office setting for the telephone company She is from [place], has lived there all of her life.   No Known Allergies   Outpatient Medications Prior to Visit  Medication Sig Dispense Refill  . albuterol (VENTOLIN HFA) 108 (90 Base) MCG/ACT inhaler Inhale 2 puffs into the lungs every 6 (six) hours as needed for wheezing or shortness of breath.    Marland Kitchen aspirin 81 MG chewable tablet Chew by mouth daily.    . Cholecalciferol (VITAMIN D3) 10 MCG (400 UNIT) CAPS Take 400 Units by mouth daily.     Marland Kitchen acetaminophen  (TYLENOL) 325 MG tablet Take 650 mg by mouth every 6 (six) hours as needed.    Marland Kitchen albuterol (VENTOLIN HFA) 108 (90 Base) MCG/ACT inhaler Inhale 2 puffs into the lungs every 6 (six) hours as needed for wheezing or shortness of breath.    . ondansetron (ZOFRAN-ODT) 4 MG disintegrating tablet Take 1 tablet by mouth every 6 (six) hours as needed.    . pantoprazole (PROTONIX) 40 MG tablet Take 1 tablet (40 mg total) by mouth daily before breakfast. 30 tablet 5   No facility-administered medications prior to visit.        Objective:   Physical Exam Vitals:   12/31/19 1029  BP: 118/64  Pulse: 75  SpO2: 94%  Weight: 150 lb (68 kg)  Height: 5' 3.5" (1.613 m)   Gen: Pleasant, elderly woman in a wheelchair, in no distress,  normal affect  ENT: No lesions,  mouth clear,  oropharynx clear, no postnasal drip  Neck: No JVD, no stridor  Lungs: No use of accessory muscles, few fine inspiratory basilar crackles, no wheezing  Cardiovascular: RRR, heart sounds normal, no murmur or gallops, no peripheral edema  Musculoskeletal: No deformities, no cyanosis or clubbing  Neuro: alert, awake, good right upper extremity strength but decreased strength of her right lower leg and foot.  Skin: Warm, no lesions or rash       Assessment & Plan:  Interstitial lung disease (HCC) I am most suspicious that this is chronic scar (in absence of groundglass, overt honeycomb changes) due to a period of dysphagia following her stroke in 2011.  She may be having intermittent persistent silent aspiration although she denies any cough with eating or drinking, had a reassuring esophagram in July 2020.  She needs pulmonary function testing, autoimmune evaluation to look for potential contributors to ILD.  Ultimately she will require walking oximetry, likely repeat chest imaging.  Depending on the evaluation and especially any progression on CT she may be a candidate for antifibrotic therapy.  I will refer her to see Dr.  Marchelle Gearing to pick up the work-up and evaluation, help her arrange for antifibrotic therapy in the future if it becomes indicated.  Cough She denies any overt aspiration although she is at risk for this as discussed above.  She has been treated empirically for GERD.  Could consider a repeat esophagram or SLP evaluation if our suspicion for chronic aspiration persists.  An ENT evaluation did not identify a structural abnormality.  She does have some clear mucus production, question chronic allergic drainage down the back of her throat.  She has not been tried on antihistamines and I think this would be reasonable.  She will try  loratadine, see if she gets any benefit.  Continue her cough drops for suppression.    Baltazar Apo, MD, PhD 12/31/2019, 11:08 AM [facility] [phone] or if no answer [phone]

## 2020-01-01 LAB — ANTI-SMITH ANTIBODY: ENA SM Ab Ser-aCnc: <1 AI

## 2020-01-01 LAB — ANTI-JO 1 ANTIBODY, IGG: Anti JO-1: <0.2 AI (ref 0.0–0.9)

## 2020-01-01 NOTE — Telephone Encounter (Signed)
Pt' daughter states that the patient will have to pay out of pocket for the CT & that the patient just paid for a CT in August & can't pay for another one so soon.  Ok to cancel & when should pt be seen next?  Before or after the PFT?

## 2020-01-01 NOTE — Telephone Encounter (Signed)
CT High resolution ordered. PCC's after scheduling CT can you advise pt to make an appt with MR a few days after the CT is done so he can review it with them in the office. Thank you.

## 2020-01-01 NOTE — Telephone Encounter (Signed)
I spoke with the daughter and scheduled PFT for 02/24/2020 at 2:00 and Covid testing 02/20/2020 at 10:05am. I have to make an appointment with MR per RB instructions. MR do you want her to have CT High Res before she sees you? She had a CT no contrast in August 2020. I can schedule her on ILD clinic for 30 min appt but wanted to ask you about the CT scan first. Please advise.    Patient Instructions by Leslye Peer, MD at 12/31/2019 10:15 AM Author: Leslye Peer, MD Author Type: Physician Filed: 12/31/2019 11:03 AM  Note Status: Addendum Cosign: Cosign Not Required Encounter Date: 12/31/2019  Editor: Leslye Peer, MD (Physician)  Prior Versions: 1. Leslye Peer, MD (Physician) at 12/31/2019 11:02 AM - Addendum   2. Leslye Peer, MD (Physician) at 12/31/2019 11:00 AM - Signed    We will perform blood work today to evaluate for possible inflammatory causes of interstitial lung disease (lung scarring). We will arrange for pulmonary function testing Try starting loratadine 10 mg daily to see if this decreases drainage to your throat, decreases your cough. Continue to use your cough drops as needed. We will arrange for you to follow-up with Dr. Marchelle Gearing in our office to review your lab work, plan any repeat CT scan of your chest.  Based on this information he may consider recommending medication aimed at decreasing progression of scarring.

## 2020-01-01 NOTE — Telephone Encounter (Signed)
MR please advise on Holly's message below- thanks!

## 2020-01-01 NOTE — Telephone Encounter (Signed)
Yes definitely do High Resolution CT chest without contrast on ILD protocol. DO both Supine and Prone Images. Only  Dr Leanna Battles or Dr Cleone Slim Dr. Trudie Reed to read or  Dr Lauralyn Primes

## 2020-01-02 LAB — RNP ANTIBODY: Ribonucleic Protein(ENA) Antibody, IgG: <1 AI

## 2020-01-02 LAB — ANA: Anti Nuclear Antibody (ANA): NEGATIVE

## 2020-01-02 LAB — CYCLIC CITRUL PEPTIDE ANTIBODY, IGG: Cyclic Citrullin Peptide Ab: <16 UNITS

## 2020-01-02 LAB — ALDOLASE: Aldolase: 2.7 U/L (ref <=8.1)

## 2020-01-02 LAB — ANTI-SCLERODERMA ANTIBODY: Scleroderma (Scl-70) (ENA) Antibody, IgG: 1.5 AI — AB

## 2020-01-02 LAB — RHEUMATOID FACTOR: Rheumatoid fact SerPl-aCnc: 18 IU/mL — ABNORMAL HIGH (ref <14)

## 2020-01-02 LAB — SJOGREN'S SYNDROME ANTIBODS(SSA + SSB)
SSA (Ro) (ENA) Antibody, IgG: <1 AI
SSB (La) (ENA) Antibody, IgG: <1 AI

## 2020-01-02 LAB — ANTI-DNA ANTIBODY, DOUBLE-STRANDED: ds DNA Ab: 7 IU/mL — ABNORMAL HIGH

## 2020-01-02 LAB — ANGIOTENSIN CONVERTING ENZYME: Angiotensin-Converting Enzyme: 35 U/L (ref 9–67)

## 2020-01-04 NOTE — Telephone Encounter (Signed)
Called and spoke with pt's daughter Dominique Jenkins letting her know that MR said it was okay to cancel the HRCT and stated to her that we would get pt in for an appt with MR after the PFT. Rolanda verbalized understanding. Stated to her that I was going to mail ILD questionnaire packet for them to fill out and bring to appt when pt sees MR and she verbalized understanding. I verified with Rolanda the address we had on file to make sure it was correct and have placed the ILD packet in mail for pt. Nothing further needed.

## 2020-01-04 NOTE — Telephone Encounter (Signed)
Ok cancel HRCT 01/14/2020. Not sure why insurance is being difficult  Give 30 min slot - ILD preferred but any day to see me - has to be after the PFT  Needs to do ILD questionnaire pre-visit

## 2020-01-14 ENCOUNTER — Other Ambulatory Visit: Payer: Medicare PPO

## 2020-01-21 ENCOUNTER — Telehealth: Payer: Self-pay | Admitting: Emergency Medicine

## 2020-01-21 MED ORDER — BENZONATATE 200 MG PO CAPS: 200.0000 mg | ORAL_CAPSULE | Freq: Four times a day (QID) | ORAL | 0 refills | Status: AC | PRN

## 2020-01-21 NOTE — Telephone Encounter (Signed)
Make sure she is still taking the loratadine. Also try adding nasal saline rinses qd.  OK to give script for tessalon perles 200mg  q6h prn for cough.

## 2020-01-21 NOTE — Telephone Encounter (Signed)
One of her inflammation labs came back positive - the lab associated with scleroderma which can cause inflammation in the lung, swallowing difficulties, skin changes. Unclear whether this is responsible for Pt's interstitial lung changes, but it could be a contributor and something we need to investigate further. We will need to discuss next steps at her follow up OV.

## 2020-01-21 NOTE — Telephone Encounter (Signed)
I called and spoke with the pt's daughter and notified of lab results and she verbalized understanding

## 2020-01-21 NOTE — Telephone Encounter (Signed)
Spoke with the pt's daughter and notified of recs per Dr Delton Coombes  She verbalized understanding  She is still on the loratidine and will continue this  Tessalon sent She states that she still is needing pt's lab results  Please advise thanks

## 2020-01-21 NOTE — Telephone Encounter (Signed)
Spoke with Dominique Jenkins, she is requesting lab results. She is also requesting a medication for cough. She has been giving her robitussin, claritin, and albuterol. She states the cough brings up clear phlegm but doesn't report any other symptoms. Is there anything else she can try to suppress. RB please advise.

## 2020-02-04 ENCOUNTER — Ambulatory Visit: Payer: Medicare PPO

## 2020-02-09 ENCOUNTER — Ambulatory Visit: Payer: Medicare PPO | Attending: Internal Medicine

## 2020-02-09 DIAGNOSIS — Z23 Encounter for immunization: Secondary | ICD-10-CM | POA: Insufficient documentation

## 2020-02-09 NOTE — Progress Notes (Signed)
   Covid-19 Vaccination Clinic  Name:  Dominique Jenkins    MRN: [id number] DOB: January 25, 1941  02/09/2020  [name] was observed post Covid-19 immunization for 15 minutes without incident. She was provided with Vaccine Information Sheet and instruction to access the V-Safe system.   [name] was instructed to call 911 with any severe reactions post vaccine: Marland Kitchen Difficulty breathing  . Swelling of face and throat  . A fast heartbeat  . A bad rash all over body  . Dizziness and weakness   Immunizations Administered    Name Date Dose VIS Date Route   Moderna COVID-19 Vaccine 02/09/2020  9:13 AM 0.5 mL 11/10/2019 Intramuscular   Manufacturer: Moderna   Lot: [id number]   NDC: 04799-872-15

## 2020-02-20 ENCOUNTER — Other Ambulatory Visit (HOSPITAL_COMMUNITY): Payer: Medicare PPO

## 2020-02-20 ENCOUNTER — Telehealth: Payer: Self-pay | Admitting: Pulmonary Disease

## 2020-02-20 NOTE — Telephone Encounter (Signed)
Patient called answering service about preprocedure testing testing   I did call preprocedure testing and they said she has an appointment today   I did call to update patient, left message on voicemail

## 2020-03-08 ENCOUNTER — Ambulatory Visit: Payer: Medicare PPO | Attending: Internal Medicine

## 2020-03-08 DIAGNOSIS — Z23 Encounter for immunization: Secondary | ICD-10-CM

## 2020-03-08 NOTE — Progress Notes (Signed)
   Covid-19 Vaccination Clinic  Name:  Dominique Jenkins    MRN: [id number] DOB: 1941/03/09  03/08/2020  [name] was observed post Covid-19 immunization for 15 minutes without incident. She was provided with Vaccine Information Sheet and instruction to access the V-Safe system.   Ms. Round was instructed to call 911 with any severe reactions post vaccine: Marland Kitchen Difficulty breathing  . Swelling of face and throat  . A fast heartbeat  . A bad rash all over body  . Dizziness and weakness   Immunizations Administered    Name Date Dose VIS Date Route   Moderna COVID-19 Vaccine 03/08/2020  9:14 AM 0.5 mL 11/10/2019 Intramuscular   Manufacturer: Moderna   Lot: [id number]   NDC: 67209-198-02

## 2020-04-29 ENCOUNTER — Other Ambulatory Visit (HOSPITAL_COMMUNITY)
Admission: RE | Admit: 2020-04-29 | Discharge: 2020-04-29 | Disposition: A | Discharge status: Home / Self Care | Payer: Medicare PPO | Source: Ambulatory Visit | Attending: Emergency Medicine | Admitting: Emergency Medicine

## 2020-04-29 DIAGNOSIS — Z01812 Encounter for preprocedural laboratory examination: Secondary | ICD-10-CM | POA: Insufficient documentation

## 2020-04-29 DIAGNOSIS — Z20822 Contact with and (suspected) exposure to covid-19: Secondary | ICD-10-CM | POA: Insufficient documentation

## 2020-04-29 LAB — SARS CORONAVIRUS 2 (TAT 6-24 HRS): SARS Coronavirus 2: NEGATIVE

## 2020-04-30 ENCOUNTER — Other Ambulatory Visit (HOSPITAL_COMMUNITY): Payer: Medicare PPO

## 2020-05-03 ENCOUNTER — Ambulatory Visit (INDEPENDENT_AMBULATORY_CARE_PROVIDER_SITE_OTHER): Payer: Medicare PPO | Admitting: Emergency Medicine

## 2020-05-03 ENCOUNTER — Other Ambulatory Visit: Payer: Self-pay

## 2020-05-03 DIAGNOSIS — J849 Interstitial pulmonary disease, unspecified: Secondary | ICD-10-CM

## 2020-05-03 LAB — PULMONARY FUNCTION TEST
FEF 25-75 Pre: 0.46 L/sec
FEF2575-%Pred-Pre: 35 %
FEV1-%Pred-Pre: 39 %
FEV1-Pre: 0.6 L
FEV1FVC-%Pred-Pre: 96 %
FEV6-%Pred-Pre: 43 %
FEV6-Pre: 0.82 L
FEV6FVC-%Pred-Pre: 104 %
FVC-%Pred-Pre: 41 %
FVC-Pre: 0.82 L
Pre FEV1/FVC ratio: 73 %
Pre FEV6/FVC Ratio: 100 %

## 2020-05-03 NOTE — Progress Notes (Signed)
Patient was unable to complete PFT. Patient was accompanied by her daughter who tried to assist. Patient's cognitive state not good as she could not understand what she was being asked to do. Patient did not want to proceed after trying to do spirometry 4 times. Test ended.

## 2020-05-07 ENCOUNTER — Other Ambulatory Visit (HOSPITAL_COMMUNITY): Payer: Medicare PPO

## 2020-09-19 ENCOUNTER — Other Ambulatory Visit: Payer: Self-pay

## 2020-09-19 ENCOUNTER — Encounter: Payer: Self-pay | Admitting: Emergency Medicine

## 2020-09-19 ENCOUNTER — Ambulatory Visit: Payer: Medicare PPO | Admitting: Emergency Medicine

## 2020-09-19 DIAGNOSIS — R059 Cough, unspecified: Secondary | ICD-10-CM

## 2020-09-19 DIAGNOSIS — J849 Interstitial pulmonary disease, unspecified: Secondary | ICD-10-CM | POA: Diagnosis not present

## 2020-09-19 NOTE — Progress Notes (Signed)
Subjective:    Patient ID: Dominique Jenkins, female    DOB: [birth year], 79 y.o.   MRN: [id number]  HPI Dominique Jenkins is 79, never smoker with a history of CVA and residual right-sided weakness, associated dysphagia that required gastrostomy tube for 3 months, now able to take enteral nutrition.  She is referred today for interstitial lung disease on CT chest from 07/23/2019 in the setting of cough. She has been under evaluation for cough, reassuring upper airway evaluation by ENT and subsequently GI evaluation.  An esophagram done July 2020 showed some dysphagia and significant esophageal dysmotility at the distal esophagus without obvious obstruction or narrowing.  She reports today that she has had dry cough for about a year, can be productive of clear.  No real nasal gtt. Not really associated with sleep or meals. She denies any dysphagia, cough w meals or drinking. Trial of PPI did not change the cough. She was also given albuterol - uses it a couple times a week but no real change. She is able to get around the house w a walker, no associated dyspnea. She still uses an exercise bike.   CT abdomen and CT chest done on 07/23/2019 reviewed by me, show subpleural reticulation with some associated bronchiectatic change, base predominant with some scarring noted along the posterior right major fissure.  There is no overt honeycomb change but overall the distribution is in a UIP pattern.  No groundglass.  ROV 09/19/20 --follow-up visit for 79 year old woman with a history of CVA and residual right-sided weakness, dysphagia that has improved such that she can now take p.o.  She has cough and interstitial lung disease on CT chest.  She has esophageal dysmotility.  Her cough did not respond to a trial of PPI or a trial of albuterol.  She was unable to do PFT.  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  At her last visit we started loratadine, she reports  that she is still coughing but less. Happens when she lays down, also some in the morning. Her PCP gave her benzonatate, seems to help.    Review of Systems  Constitutional: Negative.   HENT: Negative.   Respiratory: Positive for cough.   Cardiovascular: Negative.   Gastrointestinal: Negative.         Objective:   Physical Exam Vitals:   09/19/20 1145  BP: 121/68  Pulse: 72  Temp: 97.9 F (36.6 C)  TempSrc: Temporal  SpO2: 96%  Weight: 150 lb 6.4 oz (68.2 kg)  Height: 5' 3.5" (1.613 m)   Gen: Pleasant, elderly woman in a wheelchair, kyphosis, in no distress,  normal affect  ENT: No lesions,  mouth clear,  oropharynx clear, no postnasal drip  Neck: No JVD, no stridor  Lungs: No use of accessory muscles, few fine inspiratory basilar crackles and squeaks, no wheeze  Cardiovascular: RRR, heart sounds normal, no murmur or gallops, no peripheral edema  Musculoskeletal: No deformities, no cyanosis or clubbing  Neuro: alert, awake, good right upper extremity strength but decreased strength of her right lower leg and foot.  Skin: Warm, no lesions or rash, no evidence scleroderma       Assessment & Plan:  Cough She may have gotten some benefit from adding loratadine.  She still has mucus, clears the mucus in the morning when she gets up after sleeping or in the nighttime if she has to get out of bed.  She may benefit going forward from  addition of a nasal steroid.  She did not get any benefit from PPI in the past.  Question whether she may still be having some intermittent reflux given her esophageal dysmotility.  Cough could be related to her ILD.  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  She does have esophageal dysmotility documented on esophagram although no skin changes that I can see.  Question whether her ILD is related to autoimmune disease in addition to her remote history of dysphagia  following her stroke.  For now we will follow symptoms, plan to repeat her CT chest in about 6 months.  Depending on the symptoms, any radiographical progression, we can decide whether to send her to the ILD clinic.    Levy Pupa, MD, PhD 09/19/2020, 12:20 PM [facility] [phone] or if no answer [phone]

## 2020-09-19 NOTE — Patient Instructions (Signed)
Please continue your Claritin 10 mg once daily. We will plan to repeat your CT scan of the chest next March 2022 Follow Dr. Delton Coombes in March to review your CT scan.  Depending on the results and how your breathing, coughing or doing we may decide to have you see our colleagues here in the interstitial lung disease (ILD) clinic

## 2020-09-19 NOTE — Assessment & Plan Note (Signed)
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  She does have esophageal dysmotility documented on esophagram although no skin changes that I can see.  Question whether her ILD is related to autoimmune disease in addition to her remote history of dysphagia following her stroke.  For now we will follow symptoms, plan to repeat her CT chest in about 6 months.  Depending on the symptoms, any radiographical progression, we can decide whether to send her to the ILD clinic.

## 2020-09-19 NOTE — Assessment & Plan Note (Signed)
She may have gotten some benefit from adding loratadine.  She still has mucus, clears the mucus in the morning when she gets up after sleeping or in the nighttime if she has to get out of bed.  She may benefit going forward from addition of a nasal steroid.  She did not get any benefit from PPI in the past.  Question whether she may still be having some intermittent reflux given her esophageal dysmotility.  Cough could be related to her ILD.  Plan continue same for now engage symptoms

## 2020-12-20 ENCOUNTER — Telehealth: Payer: Self-pay | Admitting: Emergency Medicine

## 2020-12-20 DIAGNOSIS — R059 Cough, unspecified: Secondary | ICD-10-CM

## 2020-12-20 DIAGNOSIS — J849 Interstitial pulmonary disease, unspecified: Secondary | ICD-10-CM

## 2020-12-20 MED ORDER — ALBUTEROL SULFATE (2.5 MG/3ML) 0.083% IN NEBU: 2.5000 mg | INHALATION_SOLUTION | Freq: Four times a day (QID) | RESPIRATORY_TRACT | 1 refills | Status: AC | PRN

## 2020-12-20 NOTE — Telephone Encounter (Signed)
Spoke with patient's daughter. She is aware of RB's response. Will go ahead and send the albuterol solution to [facility] in [place]. Will place order for nebulizer machine.   Nothing further needed at time of call.

## 2020-12-20 NOTE — Telephone Encounter (Signed)
Called and spoke with patient's daughter Maren Beach. She stated that her and her mother had a conversation about possibly getting an order for a nebulizer machine and albuterol solution for her cough. She currently has an albuterol inhaler and this seems to be helping with the cough. They are wondering if the albuterol solution would work better for her. She is not currently established with any DME company.   RB, please advise on the nebulizer machine. Thanks!

## 2020-12-20 NOTE — Telephone Encounter (Signed)
She was unable to do PFT so difficult to say whether she has significant obstruction. If she believes she is benefiting from the albuterol inhaler, then I am OK with ordering a nebulizer for her. She can use albuterol nebs up to every 4 hours if needed for SOB or cough

## 2020-12-26 ENCOUNTER — Other Ambulatory Visit: Payer: Self-pay | Admitting: Emergency Medicine

## 2021-03-02 ENCOUNTER — Encounter (HOSPITAL_COMMUNITY): Payer: Self-pay

## 2021-03-02 ENCOUNTER — Other Ambulatory Visit: Payer: Self-pay

## 2021-03-02 ENCOUNTER — Ambulatory Visit (HOSPITAL_COMMUNITY)
Admission: RE | Admit: 2021-03-02 | Discharge: 2021-03-02 | Disposition: A | Discharge status: Home / Self Care | Payer: Medicare PPO | Source: Ambulatory Visit | Attending: Emergency Medicine | Admitting: Emergency Medicine

## 2021-03-02 DIAGNOSIS — J849 Interstitial pulmonary disease, unspecified: Secondary | ICD-10-CM | POA: Diagnosis not present

## 2021-04-10 ENCOUNTER — Telehealth: Payer: Self-pay | Admitting: Emergency Medicine

## 2021-04-10 NOTE — Telephone Encounter (Signed)
Called and spoke with Patient's Daughter Jannet Mantis (DPR). Dr. Kavin Leech HRCT results and recommendations given. Understanding stated.  Nothing further at this time.

## 2022-04-09 DEATH — deceased

## 2022-04-17 ENCOUNTER — Ambulatory Visit: Payer: Self-pay | Admitting: Emergency Medicine
